# Patient Record
Sex: Male | Born: 1970 | Race: White | Hispanic: No | Marital: Married | Smoking: Current every day smoker
Health system: Southern US, Community
[De-identification: ages and names within clinical notes are randomized; demographics above are authoritative.]

## PROBLEM LIST (undated history)

## (undated) DIAGNOSIS — I10 Essential (primary) hypertension: Secondary | ICD-10-CM

## (undated) DIAGNOSIS — M6740 Ganglion, unspecified site: Secondary | ICD-10-CM

## (undated) HISTORY — PX: WISDOM TOOTH EXTRACTION: SHX21

---

## 2011-08-03 DIAGNOSIS — M6740 Ganglion, unspecified site: Secondary | ICD-10-CM

## 2011-08-03 HISTORY — DX: Ganglion, unspecified site: M67.40

## 2011-08-10 ENCOUNTER — Encounter (HOSPITAL_BASED_OUTPATIENT_CLINIC_OR_DEPARTMENT_OTHER): Payer: Self-pay | Admitting: *Deleted

## 2011-08-13 ENCOUNTER — Other Ambulatory Visit: Payer: Self-pay | Admitting: Orthopedic Surgery

## 2011-08-17 ENCOUNTER — Encounter (HOSPITAL_BASED_OUTPATIENT_CLINIC_OR_DEPARTMENT_OTHER): Payer: Self-pay | Admitting: Certified Registered Nurse Anesthetist

## 2011-08-17 ENCOUNTER — Ambulatory Visit (HOSPITAL_BASED_OUTPATIENT_CLINIC_OR_DEPARTMENT_OTHER): Payer: 59 | Admitting: Certified Registered Nurse Anesthetist

## 2011-08-17 ENCOUNTER — Ambulatory Visit (HOSPITAL_BASED_OUTPATIENT_CLINIC_OR_DEPARTMENT_OTHER)
Admission: RE | Admit: 2011-08-17 | Discharge: 2011-08-17 | Disposition: A | Discharge status: Home / Self Care | Payer: 59 | Source: Ambulatory Visit | Attending: Orthopedic Surgery | Admitting: Orthopedic Surgery

## 2011-08-17 ENCOUNTER — Encounter (HOSPITAL_BASED_OUTPATIENT_CLINIC_OR_DEPARTMENT_OTHER): Payer: Self-pay | Admitting: Anesthesiology

## 2011-08-17 ENCOUNTER — Encounter (HOSPITAL_BASED_OUTPATIENT_CLINIC_OR_DEPARTMENT_OTHER): Payer: Self-pay | Admitting: Orthopedic Surgery

## 2011-08-17 ENCOUNTER — Other Ambulatory Visit: Payer: Self-pay | Admitting: Orthopedic Surgery

## 2011-08-17 ENCOUNTER — Encounter (HOSPITAL_BASED_OUTPATIENT_CLINIC_OR_DEPARTMENT_OTHER)
Admission: RE | Disposition: A | Discharge status: Home / Self Care | Payer: Self-pay | Source: Ambulatory Visit | Attending: Orthopedic Surgery

## 2011-08-17 ENCOUNTER — Encounter (HOSPITAL_BASED_OUTPATIENT_CLINIC_OR_DEPARTMENT_OTHER): Payer: Self-pay | Admitting: *Deleted

## 2011-08-17 DIAGNOSIS — D1610 Benign neoplasm of short bones of unspecified upper limb: Secondary | ICD-10-CM | POA: Insufficient documentation

## 2011-08-17 HISTORY — PX: EAR CYST EXCISION: SHX22

## 2011-08-17 HISTORY — DX: Ganglion, unspecified site: M67.40

## 2011-08-17 LAB — POCT HEMOGLOBIN-HEMACUE: Hemoglobin: 14.4 g/dL (ref 13.0–17.0)

## 2011-08-17 SURGERY — CYST REMOVAL
Anesthesia: Regional | Site: Hand | Laterality: Right | Wound class: Clean

## 2011-08-17 MED ORDER — MIDAZOLAM HCL 2 MG/2ML IJ SOLN: 0.5000 mg | INTRAMUSCULAR | Status: DC | PRN

## 2011-08-17 MED ORDER — HYDROCODONE-ACETAMINOPHEN 5-500 MG PO TABS: 1.0000 | ORAL_TABLET | ORAL | Status: AC | PRN

## 2011-08-17 MED ORDER — FENTANYL CITRATE 0.05 MG/ML IJ SOLN: 25.0000 ug | INTRAMUSCULAR | Status: DC | PRN

## 2011-08-17 MED ORDER — MORPHINE SULFATE 4 MG/ML IJ SOLN: 0.0500 mg/kg | INTRAMUSCULAR | Status: DC | PRN

## 2011-08-17 MED ORDER — PROPOFOL 10 MG/ML IV EMUL
INTRAVENOUS | Status: DC | PRN
  Administered 2011-08-17: 75 ug/kg/min via INTRAVENOUS

## 2011-08-17 MED ORDER — CHLORHEXIDINE GLUCONATE 4 % EX LIQD: 60.0000 mL | Freq: Once | CUTANEOUS | Status: DC

## 2011-08-17 MED ORDER — CEFAZOLIN SODIUM 1-5 GM-% IV SOLN: 1.0000 g | INTRAVENOUS | Status: DC

## 2011-08-17 MED ORDER — BUPIVACAINE HCL (PF) 0.25 % IJ SOLN
INTRAMUSCULAR | Status: DC | PRN
  Administered 2011-08-17: 10 mL

## 2011-08-17 MED ORDER — LIDOCAINE HCL (PF) 0.5 % IJ SOLN
INTRAMUSCULAR | Status: DC | PRN
  Administered 2011-08-17: 30 mL via INTRATHECAL

## 2011-08-17 MED ORDER — METOCLOPRAMIDE HCL 5 MG/ML IJ SOLN: 10.0000 mg | Freq: Once | INTRAMUSCULAR | Status: DC | PRN

## 2011-08-17 MED ORDER — FENTANYL CITRATE 0.05 MG/ML IJ SOLN: 50.0000 ug | INTRAMUSCULAR | Status: DC | PRN

## 2011-08-17 MED ORDER — MIDAZOLAM HCL 5 MG/5ML IJ SOLN
INTRAMUSCULAR | Status: DC | PRN
  Administered 2011-08-17: 2 mg via INTRAVENOUS

## 2011-08-17 MED ORDER — LACTATED RINGERS IV SOLN
INTRAVENOUS | Status: DC
  Administered 2011-08-17: 11:00:00 via INTRAVENOUS

## 2011-08-17 MED ORDER — ONDANSETRON HCL 4 MG/2ML IJ SOLN
INTRAMUSCULAR | Status: DC | PRN
  Administered 2011-08-17: 4 mg via INTRAVENOUS

## 2011-08-17 MED ORDER — CEFAZOLIN SODIUM 1-5 GM-% IV SOLN
1.0000 g | INTRAVENOUS | Status: AC
  Administered 2011-08-17: 2 g via INTRAVENOUS

## 2011-08-17 MED ORDER — FENTANYL CITRATE 0.05 MG/ML IJ SOLN
INTRAMUSCULAR | Status: DC | PRN
  Administered 2011-08-17: 100 ug via INTRAVENOUS

## 2011-08-17 SURGICAL SUPPLY — 49 items
BANDAGE COBAN STERILE 2 (GAUZE/BANDAGES/DRESSINGS) IMPLANT
BANDAGE GAUZE ELAST BULKY 4 IN (GAUZE/BANDAGES/DRESSINGS) IMPLANT
BLADE MINI RND TIP GREEN BEAV (BLADE) IMPLANT
BLADE SURG 15 STRL LF DISP TIS (BLADE) ×1 IMPLANT
BLADE SURG 15 STRL SS (BLADE) ×1
BNDG COHESIVE 1X5 TAN STRL LF (GAUZE/BANDAGES/DRESSINGS) ×2 IMPLANT
BNDG COHESIVE 3X5 TAN STRL LF (GAUZE/BANDAGES/DRESSINGS) IMPLANT
BNDG ESMARK 4X9 LF (GAUZE/BANDAGES/DRESSINGS) IMPLANT
CHLORAPREP W/TINT 26ML (MISCELLANEOUS) ×2 IMPLANT
CLOTH BEACON ORANGE TIMEOUT ST (SAFETY) ×2 IMPLANT
CORDS BIPOLAR (ELECTRODE) ×2 IMPLANT
COVER MAYO STAND STRL (DRAPES) ×2 IMPLANT
COVER TABLE BACK 60X90 (DRAPES) ×2 IMPLANT
CUFF TOURNIQUET SINGLE 18IN (TOURNIQUET CUFF) ×2 IMPLANT
DECANTER SPIKE VIAL GLASS SM (MISCELLANEOUS) IMPLANT
DRAIN PENROSE 1/2X12 LTX STRL (WOUND CARE) IMPLANT
DRAPE EXTREMITY T 121X128X90 (DRAPE) ×2 IMPLANT
DRAPE SURG 17X23 STRL (DRAPES) ×2 IMPLANT
GAUZE SPONGE 4X4 12PLY STRL LF (GAUZE/BANDAGES/DRESSINGS) ×2 IMPLANT
GAUZE XEROFORM 1X8 LF (GAUZE/BANDAGES/DRESSINGS) ×2 IMPLANT
GLOVE BIO SURGEON STRL SZ 6.5 (GLOVE) IMPLANT
GLOVE SKINSENSE NS SZ6.5 (GLOVE) ×1
GLOVE SKINSENSE STRL SZ6.5 (GLOVE) ×1 IMPLANT
GLOVE SURG ORTHO 8.0 STRL STRW (GLOVE) ×2 IMPLANT
GOWN BRE IMP PREV XXLGXLNG (GOWN DISPOSABLE) ×2 IMPLANT
GOWN PREVENTION PLUS XLARGE (GOWN DISPOSABLE) ×2 IMPLANT
NEEDLE 27GAX1X1/2 (NEEDLE) ×2 IMPLANT
NS IRRIG 1000ML POUR BTL (IV SOLUTION) ×2 IMPLANT
PACK BASIN DAY SURGERY FS (CUSTOM PROCEDURE TRAY) ×2 IMPLANT
PAD CAST 3X4 CTTN HI CHSV (CAST SUPPLIES) IMPLANT
PADDING CAST ABS 3INX4YD NS (CAST SUPPLIES)
PADDING CAST ABS 4INX4YD NS (CAST SUPPLIES)
PADDING CAST ABS COTTON 3X4 (CAST SUPPLIES) IMPLANT
PADDING CAST ABS COTTON 4X4 ST (CAST SUPPLIES) IMPLANT
PADDING CAST COTTON 3X4 STRL (CAST SUPPLIES)
SPLINT FINGER 5/8X3.25 (SOFTGOODS) ×1 IMPLANT
SPLINT FINGER FOAM 3 9119 05 (SOFTGOODS) ×2
SPLINT PLASTER CAST XFAST 3X15 (CAST SUPPLIES) IMPLANT
SPLINT PLASTER XTRA FASTSET 3X (CAST SUPPLIES)
SPONGE GAUZE 4X4 12PLY (GAUZE/BANDAGES/DRESSINGS) IMPLANT
STOCKINETTE 4X48 STRL (DRAPES) ×2 IMPLANT
SUT VIC AB 4-0 P2 18 (SUTURE) IMPLANT
SUT VICRYL RAPID 5 0 P 3 (SUTURE) IMPLANT
SUT VICRYL RAPIDE 4/0 PS 2 (SUTURE) ×2 IMPLANT
SYR BULB 3OZ (MISCELLANEOUS) ×2 IMPLANT
SYR CONTROL 10ML LL (SYRINGE) ×2 IMPLANT
TOWEL OR 17X24 6PK STRL BLUE (TOWEL DISPOSABLE) ×2 IMPLANT
UNDERPAD 30X30 INCONTINENT (UNDERPADS AND DIAPERS) ×2 IMPLANT
WATER STERILE IRR 1000ML POUR (IV SOLUTION) IMPLANT

## 2011-08-17 NOTE — H&P (Signed)
  Billy Mccarty is a 41 year old right hand dominant male who comes in complaining of a mass over the DIP joint of his right middle finger. This has been present since November. He states that frequently it will pop. He has drained clear fluid out of it. He has a history of gout however. He has no history of diabetes, thyroid problems or arthritis. He complains of intermittent, moderate, stabbing and throbbing pain with a feeling of swelling. It does occasionally awaken him at night. He has not had any treatment. He states that it was open this past weekend.   Past Medical History: He has no allergies. He takes no medicines. He has had no surgery.   Family Medical History: Negative.  Social History: He smokes 2-3 cigarettes a night and is advised to quit. He drinks socially. 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  Review of Systems: Negative for 14 points. Billy Mccarty is an 41 y.o. male.   Chief Complaint: mass rmf HPI: see above  Past Medical History  Diagnosis Date  . 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 Wisdom tooth extraction     History reviewed. No pertinent family history. Social History:  reports that he has never smoked. His smokeless tobacco use includes Snuff. He reports that he drinks alcohol. He reports that he does not use illicit drugs.  Allergies: No Known Allergies  Medications Prior to Admission  Medication Dose Route Frequency Provider Last Rate Last Dose  . ceFAZolin (ANCEF) IVPB 1 g/50 mL premix  1 g Intravenous 60 min Pre-Op       . chlorhexidine (HIBICLENS) 4 % liquid 4 application  60 mL Topical Once       . fentaNYL (SUBLIMAZE) injection 50-100 mcg  50-100 mcg Intravenous PRN Constance Goltz, MD      . lactated ringers infusion   Intravenous Continuous Constance Goltz, MD 20 mL/hr at 08/17/11 1121    . midazolam (VERSED) injection 0.5-2 mg  0.5-2 mg Intravenous PRN Constance Goltz, MD       Medications Prior to Admission  Medication Sig Dispense Refill  . cephALEXin (KEFLEX) 250 MG capsule Take 250 mg by mouth 4 (four) times daily.        No results found for this or any previous visit (from the past 48 hour(s)).  No results found.   Pertinent items are noted in HPI.  Blood pressure 155/101, pulse 95, temperature 98 F (36.7 C), temperature source Oral, resp. rate 18, height 5\' 10"  (1.778 m), weight 115.667 kg (255 lb), SpO2 100.00%.  General appearance: alert, cooperative and appears stated age Head: Normocephalic, without obvious abnormality Neck: no adenopathy Resp: clear to auscultation bilaterally Cardio: regular rate and rhythm, S1, S2 normal, no murmur, click, rub or gallop GI: soft, non-tender; bowel sounds normal; no masses,  no organomegaly Extremities: extremities normal, atraumatic, no cyanosis or edema Pulses: 2+ and symmetric Skin: Skin color, texture, turgor normal. No rashes or lesions Neurologic: Grossly normal Incision/Wound: na  Assessment/Plan Diagnosis: (1) Gout. (2) Mucoid tumor.   The pre, peri and post op course are discussed along with risks and complications. He is aware there is no guarantee with surgery, possibility of infection, recurrence, injury to arteries, nerves and tendons, incomplete relief of symptoms and dystrophy.    Billy Mccarty R 08/17/2011, 11:32 AM

## 2011-08-17 NOTE — Anesthesia Preprocedure Evaluation (Signed)
Anesthesia Evaluation  Patient identified by MRN, date of birth, ID band Patient awake    Reviewed: Allergy & Precautions, H&P , NPO status , Patient's Chart, lab work & pertinent test results, reviewed documented beta blocker date and time   Airway Mallampati: II TM Distance: >3 FB Neck ROM: full    Dental   Pulmonary neg pulmonary ROS,          Cardiovascular neg cardio ROS     Neuro/Psych Negative Neurological ROS  Negative Psych ROS   GI/Hepatic negative GI ROS, Neg liver ROS,   Endo/Other  Morbid obesity  Renal/GU negative Renal ROS  Genitourinary negative   Musculoskeletal   Abdominal   Peds  Hematology negative hematology ROS (+)   Anesthesia Other Findings See surgeon's H&P   Reproductive/Obstetrics negative OB ROS                           Anesthesia Physical Anesthesia Plan  ASA: III  Anesthesia Plan: MAC and Bier Block   Post-op Pain Management:    Induction: Intravenous  Airway Management Planned: Simple Face Mask  Additional Equipment:   Intra-op Plan:   Post-operative Plan:   Informed Consent: I have reviewed the patients History and Physical, chart, labs and discussed the procedure including the risks, benefits and alternatives for the proposed anesthesia with the patient or authorized representative who has indicated his/her understanding and acceptance.     Plan Discussed with: CRNA and Surgeon  Anesthesia Plan Comments:         Anesthesia Quick Evaluation

## 2011-08-17 NOTE — Anesthesia Postprocedure Evaluation (Signed)
Anesthesia Post Note  Patient: Billy Mccarty  Procedure(s) Performed: Procedure(s) (LRB): CYST REMOVAL (Right)  Anesthesia type: MAC  Patient location: PACU  Post pain: Pain level controlled  Post assessment: Patient's Cardiovascular Status Stable  Last Vitals:  Filed Vitals:   08/17/11 1317  BP:   Pulse: 84  Temp:   Resp: 25    Post vital signs: Reviewed and stable  Level of consciousness: alert  Complications: No apparent anesthesia complications

## 2011-08-17 NOTE — Discharge Instructions (Addendum)
[facility] Instructions °Hand Surgery ° °Wound Care: °Keep your hand elevated above the level of your heart.  Do not allow it to dangle  by your side.  Keep the dressing dry and do not remove it unless your doctor advises you to do so.  He will usually change it at the time of your post-op visit.  Moving your fingers is advised to stimulate circulation but will depend on the site of your surgery.  If you have a splint applied, your doctor will advise you regarding movement. ° °Activity: °Do not drive or operate machinery today.  Rest today and then you may return to your normal activity and work as indicated by your physician. ° °Diet:  °Drink liquids today or eat a light diet.  You may resume a regular diet tomorrow.   ° °General expectations: °Pain for two to three days. °Fingers may become slightly swollen. ° °Call your doctor if any of the following occur: °Severe pain not relieved by pain medication. °Elevated temperature. °Dressing soaked with blood. °Inability to move fingers. °White or bluish color to fingers.[facility]  °[address] °[place], [place] [address] °[phone] ° ° ° ° ° ° °Post Anesthesia Home Care Instructions ° °Activity: °Get plenty of rest for the remainder of the day. A responsible adult should stay with you for 24 hours following the procedure.  °For the next 24 hours, DO NOT: °-Drive a car °-Operate machinery °-Drink alcoholic beverages °-Take any medication unless instructed by your physician °-Make any legal decisions or sign important papers. ° °Meals: °Start with liquid foods such as gelatin or soup. Progress to regular foods as tolerated. Avoid greasy, spicy, heavy foods. If nausea and/or vomiting occur, drink only clear liquids until the nausea and/or vomiting subsides. Call your physician if vomiting continues. ° °Special Instructions/Symptoms: °Your throat may feel dry or sore from the anesthesia or the breathing tube placed in your throat during  surgery. If this causes discomfort, gargle with warm salt water. The discomfort should disappear within 24 hours. ° ° ° ° ° ° Call your surgeon if you experience:  ° °1.  Fever over 101.0. °2.  Inability to urinate. °3.  Nausea and/or vomiting. °4.  Extreme swelling or bruising at the surgical site. °5.  Continued bleeding from the incision. °6.  Increased pain, redness or drainage from the incision. °7.  Problems related to your pain medication.  °

## 2011-08-17 NOTE — Brief Op Note (Signed)
08/17/2011  1:11 PM  PATIENT:  Billy Mccarty  41 y.o. male  PRE-OPERATIVE DIAGNOSIS:  Mucoid cyst distal interphangeal joint of right middle finger  POST-OPERATIVE DIAGNOSIS:  Mucoid cyst distal interphangeal joint of right middle finger  PROCEDURE:  Procedure(s) (LRB): CYST REMOVAL (Right)  SURGEON:  Surgeon(s) and Role:    * Nicki Reaper, MD - Primary  PHYSICIAN ASSISTANT:   ASSISTANTS: none   ANESTHESIA:   local and regional  EBL:  Total I/O In: 800 [I.V.:800] Out: -   BLOOD ADMINISTERED:none  DRAINS: none   LOCAL MEDICATIONS USED:  MARCAINE     SPECIMEN:  Excision  DISPOSITION OF SPECIMEN:  PATHOLOGY  COUNTS:  YES  TOURNIQUET:  * Missing tourniquet times found for documented tourniquets in log:  23015 *  DICTATION: .Other Dictation: Dictation Number [id number]  PLAN OF CARE: Discharge to home after PACU  PATIENT DISPOSITION:  PACU - hemodynamically stable.

## 2011-08-17 NOTE — Op Note (Signed)
Dictated [id number]

## 2011-08-17 NOTE — Op Note (Signed)
NAME:  Billy Mccarty, Billy Mccarty               ACCOUNT NO.:  [id number]  MEDICAL RECORD NO.:  [id number]  LOCATION:                                 FACILITY:  PHYSICIAN:  Cindee Salt, M.D.            DATE OF BIRTH:  DATE OF PROCEDURE:  08/17/2011 DATE OF DISCHARGE:                              OPERATIVE REPORT   PREOPERATIVE DIAGNOSIS:  Mucoid cyst, distal interphalangeal joint, right middle finger.  POSTOPERATIVE DIAGNOSIS:  Mucoid cyst, distal interphalangeal joint, right middle finger.  OPERATION:  Excision of cyst with debridement, distal interphalangeal joint, right middle finger.  SURGEON:  Cindee Salt, M.D.  ANESTHESIA:  Forearm-based IV regional with metacarpal block.  ANESTHESIOLOGIST:  Janetta Hora. Gelene Mink, M.D.  HISTORY:  The patient is a 41 year old male with a history of a mucoid cyst of his right middle finger.  This has opened up.  He has recently been treated with antibiotics until closure.  Certainty, no infectious process has remained.  He is admitted now for excision of the cyst and debridement, distal interphalangeal joint.  Pre, peri, postoperative course have been discussed along with the risks and complications.  He is aware that there is no guarantee with the surgery, possibility of infection; recurrence; injury to arteries; nerves; tendons; incomplete relief of symptoms; and dystrophy.  In the preoperative area, the patient is seen.  The extremity marked by both the patient and surgeon. Antibiotic given.  PROCEDURE:  The patient was brought to the operating room where a forearm-based IV regional anesthetic was carried out without difficulty, was prepped using ChloraPrep, supine position, right arm free.  A 3- minute dry time was allowed.  Time-out taken, confirming the patient and procedure.  A curvilinear incision was made.  He had some feeling, a metacarpal block was given with 0.25% Marcaine without epinephrine, 7 mL was used.  After adequate anesthesia was  afforded, the wound was deepened.  The cyst was immediately encountered with the skin.  With a sharp and blunt dissection, this was dissected free, followed down into the distal phalangeal joint.  The joint was opened on its radial aspect. A synovectomy was performed.  Osteophytes removed with a small rongeur. No further lesions were identified.  The wound was irrigated and the skin closed with interrupted 4-0 Vicryl Rapide sutures.  A sterile compressive dressing and splint to the finger was applied.  On deflation of the tourniquet, remaining fingers pinked.  He was taken to the recovery room for observation in satisfactory condition.          ______________________________ Cindee Salt, M.D.     GK/MEDQ  D:  08/17/2011  T:  08/17/2011  Job:  [id number]

## 2011-08-17 NOTE — Transfer of Care (Signed)
Immediate Anesthesia Transfer of Care Note  Patient: Billy Mccarty  Procedure(s) Performed: Procedure(s) (LRB): CYST REMOVAL (Right)  Patient Location: PACU  Anesthesia Type: MAC and Regional  Level of Consciousness: awake, alert  and oriented  Airway & Oxygen Therapy: Patient Spontanous Breathing and Patient connected to face mask oxygen  Post-op Assessment: Report given to PACU RN and Post -op Vital signs reviewed and stable  Post vital signs: Reviewed and stable  Complications: No apparent anesthesia complications

## 2011-08-17 NOTE — Anesthesia Procedure Notes (Addendum)
Performed by: Zenia Resides D   Procedure Name: MAC Date/Time: 08/17/2011 12:40 PM Performed by: Zenia Resides D Pre-anesthesia Checklist: Patient identified, Emergency Drugs available, Suction available, Patient being monitored and Timeout performed Oxygen Delivery Method: Simple face mask

## 2011-08-20 ENCOUNTER — Encounter (HOSPITAL_BASED_OUTPATIENT_CLINIC_OR_DEPARTMENT_OTHER): Payer: Self-pay | Admitting: Orthopedic Surgery

## 2016-04-17 ENCOUNTER — Ambulatory Visit (INDEPENDENT_AMBULATORY_CARE_PROVIDER_SITE_OTHER): Payer: Self-pay | Admitting: Sports Medicine

## 2016-04-24 ENCOUNTER — Encounter (INDEPENDENT_AMBULATORY_CARE_PROVIDER_SITE_OTHER): Payer: Self-pay | Admitting: Sports Medicine

## 2016-04-24 ENCOUNTER — Ambulatory Visit (INDEPENDENT_AMBULATORY_CARE_PROVIDER_SITE_OTHER): Payer: 59 | Admitting: Sports Medicine

## 2016-04-24 VITALS — BP 174/111 | HR 93 | Ht 70.0 in

## 2016-04-24 DIAGNOSIS — M25461 Effusion, right knee: Secondary | ICD-10-CM

## 2016-04-24 DIAGNOSIS — M25561 Pain in right knee: Secondary | ICD-10-CM

## 2016-04-24 MED ORDER — BUPIVACAINE HCL 0.5 % IJ SOLN
2.0000 mL | INTRAMUSCULAR | Status: AC | PRN
  Administered 2016-04-24: 2 mL via INTRA_ARTICULAR

## 2016-04-24 MED ORDER — METHYLPREDNISOLONE ACETATE 40 MG/ML IJ SUSP
80.0000 mg | INTRAMUSCULAR | Status: AC | PRN
  Administered 2016-04-24: 80 mg

## 2016-04-24 NOTE — Progress Notes (Signed)
Billy Mccarty - 45 y.o. male MRN [id number]  Date of birth: 03/21/1971  Office Visit Note: Visit Date: 04/24/2016 PCP: Billy Maid, MD Referred by: Billy Maid, MD  Assessment & Plan: Visit Diagnoses:  1. Acute pain of right knee   2. Effusion, right knee     Plan:   chronic gout issues versus pseudogout likely reflective of underlying inflammatory changes of the joint capsule. No true mechanical symptoms at this time. We'll go ahead & repeat injection today however will need to wait at minimum 3 months before any further injections. I would like to recheck lab work in 4 weeks & have him continue with the allopurinol. Okay to wean off colchicine as tolerated. Should wear compression on a regular basis. Consider repeat imaging if persistent symptoms   There are no Patient Instructions on file for this visit.  Meds & Orders: No orders of the defined types were placed in this encounter.   Orders Placed This Encounter  Procedures  . Large Joint Injection/Arthrocentesis  . US Guided Needle Placement    Follow-up: Return in about 4 weeks (around 05/22/2016) for uric acid recheck, repeat clinical exam.   Procedures: US Guided Left Knee Injection Date/Time: 04/24/2016 4:54 PM Performed by: Billy Mccarty Authorized by: Billy Mccarty   Indications:  Pain and joint swelling Location:  Knee Site:  R knee Approach:  Superolateral Ultrasound Guidance: Yes (Images stored to PACS)   Fluoroscopic Guidance: No  no Medications:  2 mL bupivacaine 0.5 %; 80 mg methylPREDNISolone acetate 40 MG/ML Aspirate amount (mL):  35 Aspirate:  Yellow and clear Comments: After informed consent was obtained & all questions were answered the target sight was prepped in sterile fashion using alcohol, ethel chloride and sterile ultrasound technique. Subsequently using a 25g needle, 26mL 1% lidocaine was used for local anesthetic via the above approach. Under real-time ultrasound guidance,  using  an 18g needle, aspirate was obtained as above. Subsequently, using sterile stopcock technique, injection of the above medications was performed without difficulty. Band-Aid and 6 inch Ace wrap was applied. Patient tolerated this procedure well with no immediate complications.     Clinical History: No additional findings.  He reports that he has never smoked. His smokeless tobacco use includes Snuff.  Subjective: Chief Complaint  Patient presents with  . Right Knee - Gout   Pt. States that taking medication for gout in Right knee. On Sunday RT knee felt bruised, Monday RT knee felt like kneewas locking up. This morning had pain in RT knee, tender on top of RT knee.  Some swelling. Had been feeling better.  Has been taking allopurinol & colchicine for the past 2 weeks with overall good improvement until he went out late on Saturday with his son. Sunday he was slightly sore. Discussing different than his typical gout flare. No fevers chills recent weight gain or weight loss.   Review of Systems  Constitutional: Negative.        Otherwise per HPI    Objective:  VS:  HT:5\' 10"  (177.8 cm)   WT:   BMI:     BP:(!) 174/111  HR:93bpm  TEMP: ( )  RESP:  Physical Exam  Constitutional: He appears well-developed and well-nourished. No distress.  Pulmonary/Chest: Effort normal. No respiratory distress.  Skin: He is not diaphoretic.  Psychiatric: He has a normal mood and affect. Judgment normal.    Right Knee Exam   Comments:  Well aligned knee. He has a large effusion on  the right with a small effusion on the left. He has no focal joint line tenderness. Extensor mechanism is intact. Slight loss of extension by 5 due to the effusion. Ligamentously stable. Sensation is intact to light touch.     Imaging: No results found.  Past Medical/Family/Surgical/Social History: There are no active problems to display for this patient.  Past Medical History:  Diagnosis Date  . Mucoid cyst of  joint 08/2011   right middle finger   No family history on file. Past Surgical History:  Procedure Laterality Date  . EAR CYST EXCISION  08/17/2011   Procedure: CYST REMOVAL;  Surgeon: Billy Sours, MD;  Location: [facility];  Service: Orthopedics;  Laterality: Right;  Excision cyst, debridement distal interphalangeal right middle finger  . WISDOM TOOTH EXTRACTION     Social History   Occupational History  . Not on file.   Social History Main Topics  . Smoking status: Never Smoker  . Smokeless tobacco: Current User    Types: Snuff  . Alcohol use Yes     Comment: few times/week  . Drug use: No  . Sexual activity: Not on file

## 2016-05-22 ENCOUNTER — Ambulatory Visit (INDEPENDENT_AMBULATORY_CARE_PROVIDER_SITE_OTHER): Payer: 59 | Admitting: Sports Medicine

## 2016-06-02 ENCOUNTER — Other Ambulatory Visit (INDEPENDENT_AMBULATORY_CARE_PROVIDER_SITE_OTHER): Payer: Self-pay | Admitting: Sports Medicine

## 2016-06-04 NOTE — Telephone Encounter (Signed)
Rx refill request

## 2016-11-30 ENCOUNTER — Other Ambulatory Visit (INDEPENDENT_AMBULATORY_CARE_PROVIDER_SITE_OTHER): Payer: Self-pay | Admitting: Sports Medicine

## 2016-12-27 ENCOUNTER — Other Ambulatory Visit (INDEPENDENT_AMBULATORY_CARE_PROVIDER_SITE_OTHER): Payer: Self-pay | Admitting: Sports Medicine

## 2017-01-08 ENCOUNTER — Telehealth: Payer: Self-pay | Admitting: Sports Medicine

## 2017-01-08 ENCOUNTER — Other Ambulatory Visit: Payer: Self-pay

## 2017-01-08 MED ORDER — ALLOPURINOL 300 MG PO TABS: ORAL_TABLET | ORAL | 0 refills | Status: DC

## 2017-01-08 NOTE — Telephone Encounter (Signed)
Please call in 2 week supply of zyloprim to [facility] on file in [place]

## 2017-01-08 NOTE — Telephone Encounter (Signed)
2 week supply sent to [facility], pt aware.

## 2017-01-11 ENCOUNTER — Encounter: Payer: Self-pay | Admitting: Sports Medicine

## 2017-01-11 ENCOUNTER — Ambulatory Visit (INDEPENDENT_AMBULATORY_CARE_PROVIDER_SITE_OTHER): Payer: 59 | Admitting: Sports Medicine

## 2017-01-11 VITALS — BP 140/100 | HR 87 | Ht 72.0 in | Wt 290.8 lb

## 2017-01-11 DIAGNOSIS — I10 Essential (primary) hypertension: Secondary | ICD-10-CM | POA: Diagnosis not present

## 2017-01-11 DIAGNOSIS — M25561 Pain in right knee: Secondary | ICD-10-CM | POA: Diagnosis not present

## 2017-01-11 DIAGNOSIS — M1A09X0 Idiopathic chronic gout, multiple sites, without tophus (tophi): Secondary | ICD-10-CM | POA: Diagnosis not present

## 2017-01-11 LAB — CBC
HCT: 44.1 % (ref 38.5–50.0)
Hemoglobin: 14.9 g/dL (ref 13.2–17.1)
MCH: 30.9 pg (ref 27.0–33.0)
MCHC: 33.8 g/dL (ref 32.0–36.0)
MCV: 91.5 fL (ref 80.0–100.0)
MPV: 9.5 fL (ref 7.5–12.5)
Platelets: 202 10*3/uL (ref 140–400)
RBC: 4.82 MIL/uL (ref 4.20–5.80)
RDW: 13.7 % (ref 11.0–15.0)
WBC: 9.8 10*3/uL (ref 3.8–10.8)

## 2017-01-11 NOTE — Progress Notes (Signed)
OFFICE VISIT NOTE Juanda Bond. [place], [place] at [place] [phone]  Billy Mccarty - 46 y.o. male MRN [id number]  Date of birth: 06/17/1971  Visit Date: 01/11/2017  PCP: Rubie Maid, MD   Referred by: Rubie Maid, MD   Jari Sportsman, cma acting as scribe for Dr. Paulla Fore.  SUBJECTIVE:   Chief Complaint  Patient presents with  . Follow-up  . Rt Knee Pain   HPI: As below and per problem based documentation when appropriate.  Billy received a steroid injection with aspiration on 04-24-2016. He is taking Allopurinol daily for sx. He reports very minimal pain when he does have an attack. Overall doing well. He is requesting a refill on medciaton today.    Review of Systems  Constitutional: Negative for chills, diaphoresis, fever, malaise/fatigue and weight loss.  HENT: Negative.   Eyes: Negative.   Respiratory: Negative.   Cardiovascular: Negative for chest pain and palpitations.  Gastrointestinal: Negative.   Genitourinary: Negative.   Musculoskeletal: Negative for back pain, falls, joint pain, myalgias and neck pain.  Skin: Negative for itching and rash.  Neurological: Negative.  Negative for weakness.  Endo/Heme/Allergies: Negative for environmental allergies and polydipsia. Does not bruise/bleed easily.  Psychiatric/Behavioral: Negative.     Otherwise per HPI.  HISTORY & PERTINENT PRIOR DATA:  No specialty comments available. He reports that he has never smoked. His smokeless tobacco use includes Snuff.   Medications & Allergies reviewed per EMR Patient Active Problem List   Diagnosis Date Noted  . Gout 02/19/2017  . Essential hypertension 02/19/2017  . Right knee pain 02/19/2017   Past Medical History:  Diagnosis Date  . 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 No pertinent family history. Past Surgical History:  Procedure Laterality Date  . EAR CYST EXCISION  08/17/2011   Procedure: CYST REMOVAL;  Surgeon: Wynonia Sours, MD;  Location: [facility];  Service: Orthopedics;  Laterality: Right;  Excision cyst, debridement distal interphalangeal right middle finger  . WISDOM TOOTH EXTRACTION     Social History   Occupational History  . Not on file.   Social History Main Topics  . Smoking status: Never Smoker  . Smokeless tobacco: Current User    Types: Snuff  . Alcohol use Yes     Comment: few times/week  . Drug use: No  . Sexual activity: Not on file    OBJECTIVE:  VS:  HT:6' (182.9 cm)   WT:290 lb 12.8 oz (131.9 kg)  BMI:39.5    BP:(!) 140/100  HR:87bpm  TEMP: ( )  RESP:98 % EXAM: Findings:  Adult male.  No acute distress.  Alert and appropriate. Bilateral lower extremities overall well aligned.  No significant deformity. His bilateral knees do have small amount of swelling with trace effusions.  Lower extremity has 1+ pitting edema bilaterally.  No pain with calf squeeze test.  He has small picking lesions on his legs but these are minimal and no evidence of infection.     No results found. ASSESSMENT & PLAN:     ICD-10-CM   1. Right knee pain, unspecified chronicity M25.561 Comprehensive metabolic panel    Uric acid    CBC  2. Idiopathic chronic gout of multiple sites without tophus M1A.09X0   3. Essential hypertension I10   ================================================================= Gout Overall he seems to be doing quite well.  He is occasionally having minimal flareups I would like to ensure that  his levels are good.  We can titrate his medications if indicated we will go ahead and recheck labs today.  Refills on his allopurinol will be provided once labs return.     We will need to six-month monitoring of lab work and I am happy to see him back at any time for acute flareups.  Essential hypertension Persistently elevated today.  Recommend close monitoring and follow-up with his PCP for further titration of his  medications.  Right knee pain Prior acute flareups with large effusion secondary to gout.  Overall seems to be doing quite well but has occasional discomfort.  Continue with compression as needed =================================================================  Follow-up: No Follow-up on file.   CMA/ATC served as Education administrator during this visit. History, Physical, and Plan performed by medical provider. Documentation and orders reviewed and attested to.      Teresa Coombs, [facility] Physician

## 2017-01-12 LAB — COMPREHENSIVE METABOLIC PANEL
ALBUMIN: 4.7 g/dL (ref 3.6–5.1)
ALT: 59 U/L — ABNORMAL HIGH (ref 9–46)
AST: 44 U/L — ABNORMAL HIGH (ref 10–40)
Alkaline Phosphatase: 77 U/L (ref 40–115)
BUN: 16 mg/dL (ref 7–25)
CALCIUM: 9.5 mg/dL (ref 8.6–10.3)
CHLORIDE: 104 mmol/L (ref 98–110)
CO2: 21 mmol/L (ref 20–31)
Creat: 1.1 mg/dL (ref 0.60–1.35)
Glucose, Bld: 95 mg/dL (ref 65–99)
POTASSIUM: 4.4 mmol/L (ref 3.5–5.3)
Sodium: 140 mmol/L (ref 135–146)
TOTAL PROTEIN: 7.2 g/dL (ref 6.1–8.1)
Total Bilirubin: 0.4 mg/dL (ref 0.2–1.2)

## 2017-01-12 LAB — URIC ACID: URIC ACID, SERUM: 5.8 mg/dL (ref 4.0–8.0)

## 2017-01-16 ENCOUNTER — Other Ambulatory Visit: Payer: Self-pay

## 2017-01-16 MED ORDER — ALLOPURINOL 300 MG PO TABS: ORAL_TABLET | ORAL | 1 refills | Status: DC

## 2017-01-17 ENCOUNTER — Ambulatory Visit: Payer: 59 | Admitting: Sports Medicine

## 2017-01-23 ENCOUNTER — Other Ambulatory Visit: Payer: Self-pay

## 2017-01-23 MED ORDER — ALLOPURINOL 300 MG PO TABS: ORAL_TABLET | ORAL | 1 refills | Status: AC

## 2017-02-19 DIAGNOSIS — M109 Gout, unspecified: Secondary | ICD-10-CM | POA: Insufficient documentation

## 2017-02-19 DIAGNOSIS — I10 Essential (primary) hypertension: Secondary | ICD-10-CM | POA: Insufficient documentation

## 2017-02-19 DIAGNOSIS — M25561 Pain in right knee: Secondary | ICD-10-CM | POA: Insufficient documentation

## 2017-02-19 NOTE — Assessment & Plan Note (Signed)
Prior acute flareups with large effusion secondary to gout.  Overall seems to be doing quite well but has occasional discomfort.  Continue with compression as needed

## 2017-02-19 NOTE — Assessment & Plan Note (Addendum)
Overall he seems to be doing quite well.  He is occasionally having minimal flareups I would like to ensure that his levels are good.  We can titrate his medications if indicated we will go ahead and recheck labs today.  Refills on his allopurinol will be provided once labs return.     We will need to six-month monitoring of lab work and I am happy to see him back at any time for acute flareups.

## 2017-02-19 NOTE — Assessment & Plan Note (Signed)
Persistently elevated today.  Recommend close monitoring and follow-up with his PCP for further titration of his medications.

## 2017-02-26 DIAGNOSIS — L638 Other alopecia areata: Secondary | ICD-10-CM | POA: Diagnosis not present

## 2017-02-27 ENCOUNTER — Other Ambulatory Visit: Payer: Self-pay | Admitting: Sports Medicine

## 2017-02-28 ENCOUNTER — Telehealth: Payer: Self-pay | Admitting: Sports Medicine

## 2017-02-28 NOTE — Telephone Encounter (Signed)
MEDICATION:   losartan (COZAAR) 25 MG tablet     PHARMACY:   [facility], [place] [phone] (Phone) [phone] (Fax)     IS THIS A 90 DAY SUPPLY : Y   IS PATIENT OUT OF MEDICATION: N  IF NOT; HOW MUCH IS LEFT: 1  LAST APPOINTMENT DATE: 01/11/17  NEXT APPOINTMENT DATE: N/A  OTHER COMMENTS:    **Let patient know to contact pharmacy at the end of the day to make sure medication is ready. **  ** Please notify patient to allow 48-72 hours to process**  **Encourage patient to contact the pharmacy for refills or they can request refills through [facility]**

## 2017-02-28 NOTE — Telephone Encounter (Signed)
Patient's wife called to check the status of the rx refill. I advised her that all sports medicine staff is gone for the day and that Dr. Paulla Fore will fill tomorrow morning, if there are any problems the nurse will call him.

## 2017-03-01 NOTE — Telephone Encounter (Signed)
I haven't been prescribing this medication.  This is a blood pressure med, not gout medication.  Needs to come from his PCP.

## 2017-03-01 NOTE — Telephone Encounter (Signed)
Per last OV note:  Recommend close monitoring and follow-up with his PCP for further titration of his medications.  Dr. Paulla Fore, Diaz to refill x 30 days and have pt f/u with PCP?

## 2017-03-01 NOTE — Telephone Encounter (Signed)
Called pt and left VM to contact the office. He needs to contact his PCP for refills of BP medication.

## 2017-03-03 ENCOUNTER — Other Ambulatory Visit: Payer: Self-pay | Admitting: Sports Medicine

## 2017-03-05 NOTE — Telephone Encounter (Signed)
Called and left VM for pt to contact his PCP for refill.

## 2017-03-28 DIAGNOSIS — L638 Other alopecia areata: Secondary | ICD-10-CM | POA: Diagnosis not present

## 2017-04-22 DIAGNOSIS — H1045 Other chronic allergic conjunctivitis: Secondary | ICD-10-CM | POA: Diagnosis not present

## 2017-06-30 ENCOUNTER — Other Ambulatory Visit: Payer: Self-pay

## 2017-06-30 ENCOUNTER — Emergency Department (HOSPITAL_BASED_OUTPATIENT_CLINIC_OR_DEPARTMENT_OTHER)
Admission: EM | Admit: 2017-06-30 | Discharge: 2017-06-30 | Disposition: A | Discharge status: Home / Self Care | Payer: 59 | Attending: Emergency Medicine | Admitting: Emergency Medicine

## 2017-06-30 ENCOUNTER — Emergency Department (HOSPITAL_BASED_OUTPATIENT_CLINIC_OR_DEPARTMENT_OTHER): Payer: 59

## 2017-06-30 ENCOUNTER — Encounter (HOSPITAL_BASED_OUTPATIENT_CLINIC_OR_DEPARTMENT_OTHER): Payer: Self-pay | Admitting: Emergency Medicine

## 2017-06-30 DIAGNOSIS — F17210 Nicotine dependence, cigarettes, uncomplicated: Secondary | ICD-10-CM | POA: Insufficient documentation

## 2017-06-30 DIAGNOSIS — Z79899 Other long term (current) drug therapy: Secondary | ICD-10-CM | POA: Insufficient documentation

## 2017-06-30 DIAGNOSIS — R079 Chest pain, unspecified: Secondary | ICD-10-CM | POA: Insufficient documentation

## 2017-06-30 DIAGNOSIS — R0789 Other chest pain: Secondary | ICD-10-CM | POA: Diagnosis not present

## 2017-06-30 DIAGNOSIS — I10 Essential (primary) hypertension: Secondary | ICD-10-CM | POA: Insufficient documentation

## 2017-06-30 HISTORY — DX: Essential (primary) hypertension: I10

## 2017-06-30 LAB — CBC
HCT: 43.1 % (ref 39.0–52.0)
Hemoglobin: 14.5 g/dL (ref 13.0–17.0)
MCH: 30.7 pg (ref 26.0–34.0)
MCHC: 33.6 g/dL (ref 30.0–36.0)
MCV: 91.3 fL (ref 78.0–100.0)
PLATELETS: 251 10*3/uL (ref 150–400)
RBC: 4.72 MIL/uL (ref 4.22–5.81)
RDW: 12.5 % (ref 11.5–15.5)
WBC: 10.5 10*3/uL (ref 4.0–10.5)

## 2017-06-30 LAB — BASIC METABOLIC PANEL
Anion gap: 9 (ref 5–15)
BUN: 19 mg/dL (ref 6–20)
CO2: 25 mmol/L (ref 22–32)
CREATININE: 0.94 mg/dL (ref 0.61–1.24)
Calcium: 9.3 mg/dL (ref 8.9–10.3)
Chloride: 101 mmol/L (ref 101–111)
GFR calc Af Amer: >60 mL/min (ref >60)
Glucose, Bld: 163 mg/dL — ABNORMAL HIGH (ref 65–99)
Potassium: 3.9 mmol/L (ref 3.5–5.1)
SODIUM: 135 mmol/L (ref 135–145)

## 2017-06-30 LAB — TROPONIN I: Troponin I: <0.03 ng/mL (ref <0.03)

## 2017-06-30 MED ORDER — ASPIRIN 81 MG PO CHEW
324.0000 mg | CHEWABLE_TABLET | Freq: Once | ORAL | Status: AC
Start: 2017-06-30 — End: 2017-06-30
  Administered 2017-06-30: 324 mg via ORAL
  Filled 2017-06-30: qty 4

## 2017-06-30 MED ORDER — LOSARTAN POTASSIUM 25 MG PO TABS: 25.0000 mg | ORAL_TABLET | Freq: Every day | ORAL | 0 refills | Status: AC

## 2017-06-30 NOTE — Discharge Instructions (Signed)
Schedule an appointment with a primary care doctor to follow-up on your blood pressure and the episode of chest pain.  Return to the emergency room for worsening or recurrent symptoms

## 2017-06-30 NOTE — ED Triage Notes (Addendum)
Patient reports chest pain that began while sitting.  States that he began feeling like he was going to pass out.  States he began feeling tight in his neck and states "I feel like my head was about to pop".  Reports hx of HTN but states he has not taken his medication due to ortho doctor prescribing medication.  Ortho doc stated he needed to follow with primary but patient has not done this yet.  States he called EMS-pt with BP 270/130 today, refused EMS transport.

## 2017-06-30 NOTE — ED Provider Notes (Signed)
[facility] EMERGENCY DEPARTMENT Provider Note   CSN: [id number] Arrival date & time: 06/30/17  1418     History   Chief Complaint Chief Complaint  Patient presents with  . Chest Pain    HPI Billy Mccarty is a 46 y.o. male.  HPI Pt started having pain in his chest today around 1:30 while sitting.  It was in the center of his chest.   It lasted about a minute in a half.  He also felt lightheaded and had tightness in his neck and head.   No shortness of breath or nausea.   No fever or vomiting.  No cough.  He had a headache earlier that he describes as his head feeling like it was going to pop but it resolved.  Pt has been told that he is hypertensive   he was given a prescription prior doctor that was treating him for gout.  He took that medication for a few months and he was told to follow-up with a primary care doctor to continue treatment of his hypertension.  Patient has not followed up.  When EMS arrived they noted that his blood pressure was very elevated over 200.  Patient did not take any medications from EMS.  He refused transport and came to the emergency room.  He is actually feeling better now than he did earlier.  No history of heart disease.  Patient does smoke.  He does have hypertension.  No mediate family history of early heart disease Past Medical History:  Diagnosis Date  . Hypertension   . Mucoid cyst of joint 08/2011   right middle finger    Patient Active Problem List   Diagnosis Date Noted  . Gout 02/19/2017  . Essential hypertension 02/19/2017  . Right knee pain 02/19/2017    Past Surgical History:  Procedure Laterality Date  . EAR CYST EXCISION  08/17/2011   Procedure: CYST REMOVAL;  Surgeon: Wynonia Sours, MD;  Location: [facility];  Service: Orthopedics;  Laterality: Right;  Excision cyst, debridement distal interphalangeal right middle finger  . WISDOM TOOTH EXTRACTION         Home Medications    Prior to Admission  medications   Medication Sig Start Date End Date Taking? Authorizing Provider  allopurinol (ZYLOPRIM) 300 MG tablet TAKE ONE TABLET BY MOUTH ONCE DAILY. 01/23/17   Gerda Diss, DO  losartan (COZAAR) 25 MG tablet Take 1 tablet (25 mg total) by mouth daily. 06/30/17   Dorie Rank, MD    Family History History reviewed. No pertinent family history.  Social History Social History   Tobacco Use  . Smoking status: Current Every Day Smoker    Packs/day: 1.00    Types: Cigarettes  . Smokeless tobacco: Current User    Types: Snuff  Substance Use Topics  . Alcohol use: Yes    Comment: few times/week  . Drug use: No     Allergies   Patient has no known allergies.   Review of Systems Review of Systems  All other systems reviewed and are negative.    Physical Exam Updated Vital Signs BP (!) 145/93   Pulse 88   Temp 98.2 F (36.8 C) (Oral)   Resp 15   Ht 1.778 m (5\' 10" )   Wt 124.7 kg (275 lb)   SpO2 98%   BMI 39.46 kg/m   Physical Exam  Constitutional: He appears well-developed and well-nourished. No distress.  HENT:  Head: Normocephalic and atraumatic.  Right  Ear: External ear normal.  Left Ear: External ear normal.  Eyes: Conjunctivae are normal. Right eye exhibits no discharge. Left eye exhibits no discharge. No scleral icterus.  Neck: Neck supple. No tracheal deviation present.  Cardiovascular: Normal rate, regular rhythm and intact distal pulses.  Pulmonary/Chest: Effort normal and breath sounds normal. No stridor. No respiratory distress. He has no wheezes. He has no rales.  Abdominal: Soft. Bowel sounds are normal. He exhibits no distension. There is no tenderness. There is no rebound and no guarding.  Musculoskeletal: He exhibits no edema or tenderness.  Neurological: He is alert. He has normal strength. No cranial nerve deficit (no facial droop, extraocular movements intact, no slurred speech) or sensory deficit. He exhibits normal muscle tone. He displays  no seizure activity. Coordination normal.  Skin: Skin is warm and dry. No rash noted.  Psychiatric: He has a normal mood and affect.  Nursing note and vitals reviewed.    ED Treatments / Results  Labs (all labs ordered are listed, but only abnormal results are displayed) Labs Reviewed  BASIC METABOLIC PANEL - Abnormal; Notable for the following components:      Result Value   Glucose, Bld 163 (*)    All other components within normal limits  TROPONIN I  TROPONIN I  CBC    EKG  EKG Interpretation  Date/Time:  "Sunday June 30 2017 14:21:52 EST Ventricular Rate:  86 PR Interval:  164 QRS Duration: 92 QT Interval:  354 QTC Calculation: 423 R Axis:   -27 Text Interpretation:  Normal sinus rhythm Normal ECG No old tracing to compare Confirmed by Dyneshia Baccam ([id number]) on 06/30/2017 3:04:47 PM       Radiology Dg Chest 2 View  Result Date: 06/30/2017 CLINICAL DATA:  Chest pain today EXAM: CHEST  2 VIEW COMPARISON:  None. FINDINGS: The heart size and mediastinal contours are within normal limits. There is no focal infiltrate, pulmonary edema, or pleural effusion. Osteophytosis of bilateral first rib manubrial joints are noted. IMPRESSION: No active cardiopulmonary disease. Electronically Signed   By: Wei-Chen  Lin M.D.   On: 06/30/2017 15:47    Procedures Procedures (including critical care time)  Medications Ordered in ED Medications  aspirin chewable tablet 324 mg (324 mg Oral Given 06/30/17 1539)     Initial Impression / Assessment and Plan / ED Course  I have reviewed the triage vital signs and the nursing notes.  Pertinent labs & imaging results that were available during my care of the patient were reviewed by me and considered in my medical decision making (see chart for details).   Patient presented to the emergency room for evaluation of chest pain.  Patient is overall low risk for cardiac disease.  He does however have a history of hypertension that he has not  been treating.  Patient's EKG and serial cardiac enzymes are reassuring.  His blood pressure improved with treatment.  I think he stable for discharge and outpatient follow-up.  I do think is reasonable to have a stress test as an outpatient.  We discussed the importance of close follow-up.  he is comfortable with this plan understands return if he has any recurrent episodes.  Final Clinical Impressions(s) / ED Diagnoses   Final diagnoses:  Chest pain, unspecified type    ED Discharge Orders        Ordered    losartan (COZAAR) 25 MG tablet  Daily     12" /30/18 2001       Dorie Rank, MD  06/30/17 2004  

## 2017-07-18 DIAGNOSIS — I10 Essential (primary) hypertension: Secondary | ICD-10-CM | POA: Diagnosis not present

## 2017-07-18 DIAGNOSIS — Z13220 Encounter for screening for lipoid disorders: Secondary | ICD-10-CM | POA: Diagnosis not present

## 2017-07-18 DIAGNOSIS — M1A09X0 Idiopathic chronic gout, multiple sites, without tophus (tophi): Secondary | ICD-10-CM | POA: Diagnosis not present

## 2017-08-01 DIAGNOSIS — I10 Essential (primary) hypertension: Secondary | ICD-10-CM | POA: Diagnosis not present

## 2017-10-21 DIAGNOSIS — T148XXA Other injury of unspecified body region, initial encounter: Secondary | ICD-10-CM | POA: Diagnosis not present

## 2017-11-11 DIAGNOSIS — T148XXA Other injury of unspecified body region, initial encounter: Secondary | ICD-10-CM | POA: Diagnosis not present

## 2018-01-22 DIAGNOSIS — M1A09X0 Idiopathic chronic gout, multiple sites, without tophus (tophi): Secondary | ICD-10-CM | POA: Diagnosis not present

## 2018-01-22 DIAGNOSIS — R748 Abnormal levels of other serum enzymes: Secondary | ICD-10-CM | POA: Diagnosis not present

## 2018-01-22 DIAGNOSIS — Z0000 Encounter for general adult medical examination without abnormal findings: Secondary | ICD-10-CM | POA: Diagnosis not present

## 2018-05-16 DIAGNOSIS — F17210 Nicotine dependence, cigarettes, uncomplicated: Secondary | ICD-10-CM | POA: Diagnosis not present

## 2018-05-16 DIAGNOSIS — J0100 Acute maxillary sinusitis, unspecified: Secondary | ICD-10-CM | POA: Diagnosis not present

## 2018-06-29 DIAGNOSIS — R05 Cough: Secondary | ICD-10-CM | POA: Diagnosis not present

## 2018-06-29 DIAGNOSIS — R062 Wheezing: Secondary | ICD-10-CM | POA: Diagnosis not present

## 2018-06-29 DIAGNOSIS — J209 Acute bronchitis, unspecified: Secondary | ICD-10-CM | POA: Diagnosis not present

## 2019-03-28 IMAGING — DX DG CHEST 2V
2 series · 2 of 2 positions shown · non-contrast
Comparison: None.

CLINICAL DATA: Chest pain today

EXAM:
CHEST  2 VIEW

[chest pa]
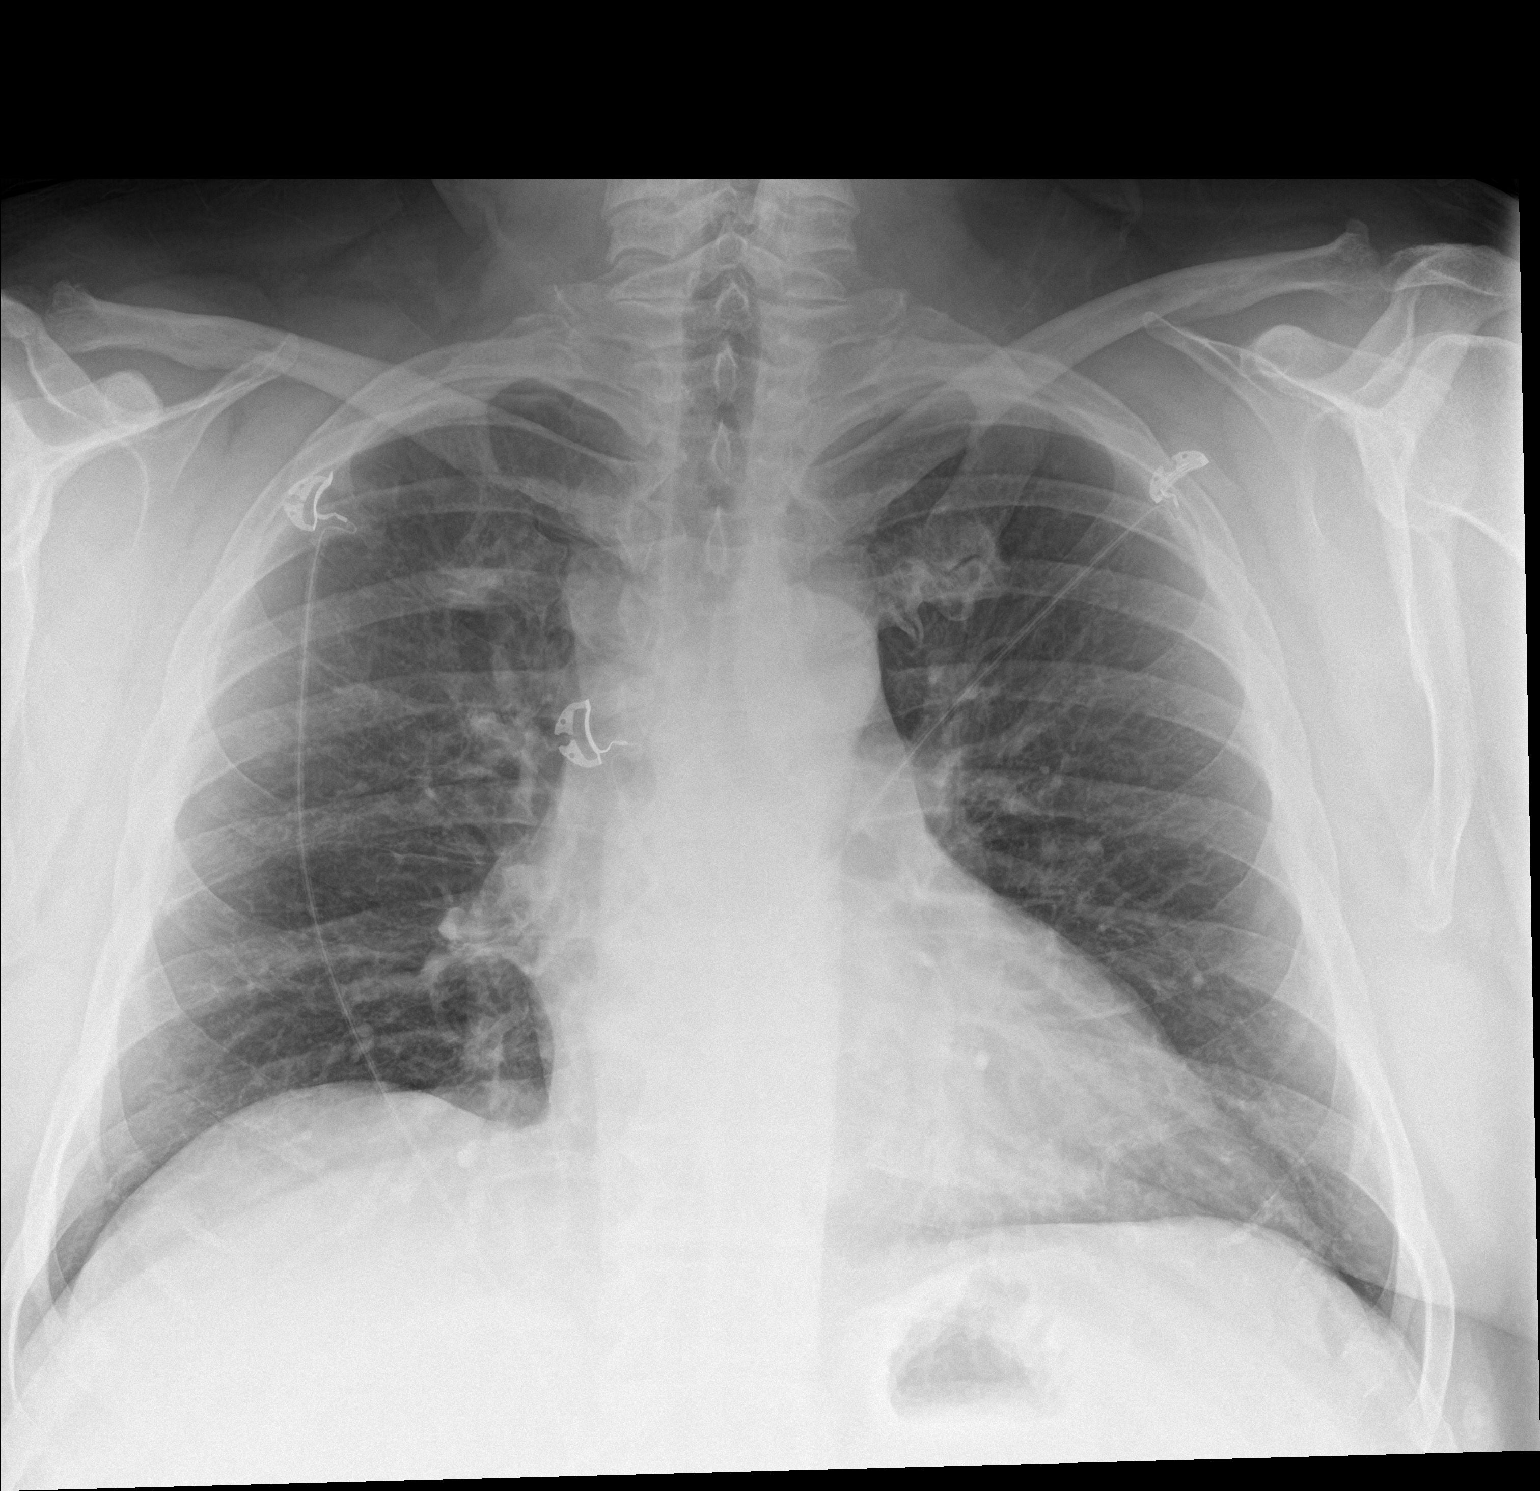

[chest lat]
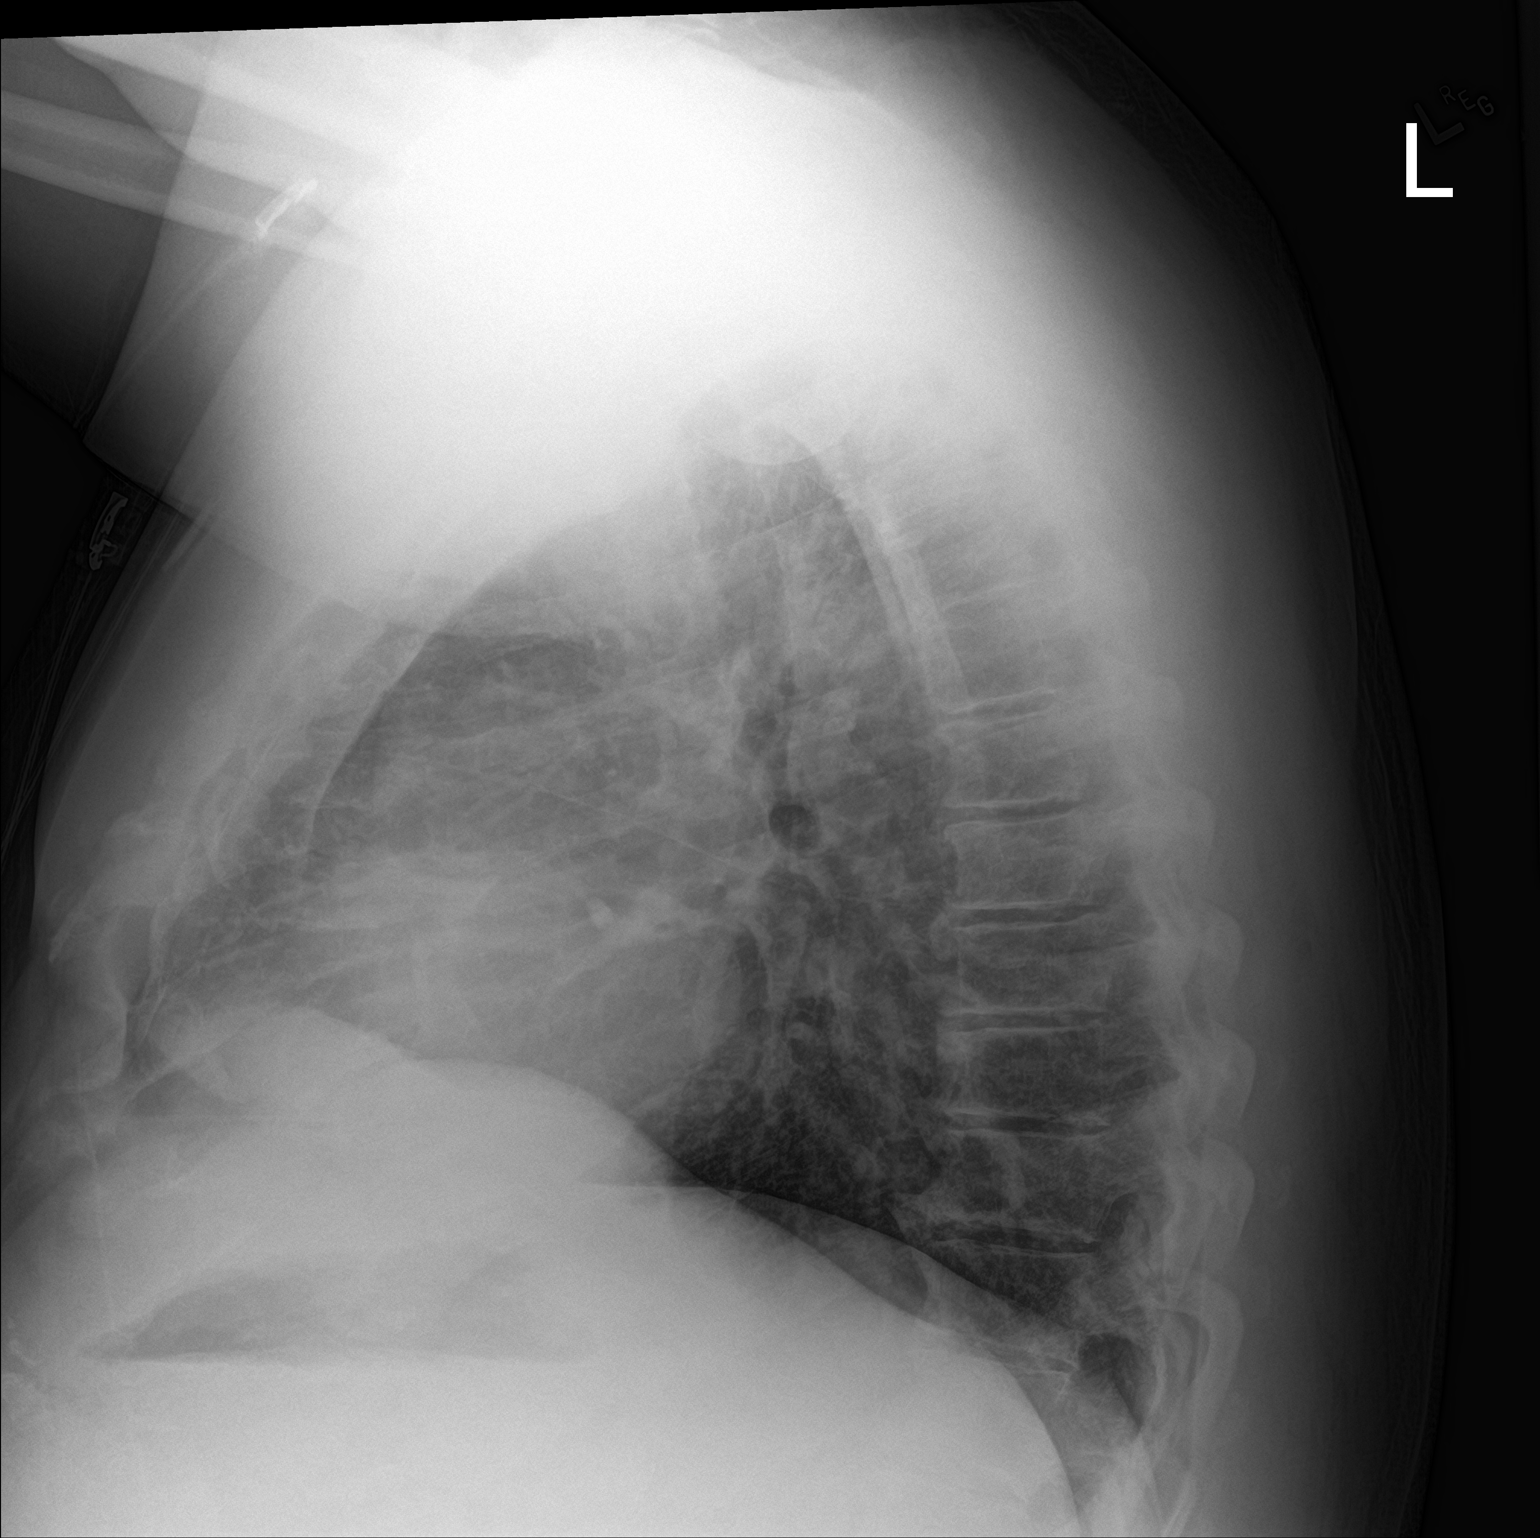

[2 of 2 positions shown; findings below may reference images not displayed]

FINDINGS: The heart size and mediastinal contours are within normal limits.
There is no focal infiltrate, pulmonary edema, or pleural effusion.
Osteophytosis of bilateral first rib manubrial joints are noted.
IMPRESSION: No active cardiopulmonary disease.

## 2022-11-28 ENCOUNTER — Other Ambulatory Visit (HOSPITAL_COMMUNITY): Payer: Self-pay

## 2022-11-30 ENCOUNTER — Other Ambulatory Visit (HOSPITAL_BASED_OUTPATIENT_CLINIC_OR_DEPARTMENT_OTHER): Payer: Self-pay

## 2022-11-30 ENCOUNTER — Other Ambulatory Visit (HOSPITAL_COMMUNITY): Payer: Self-pay

## 2022-11-30 MED ORDER — MOUNJARO 5 MG/0.5ML ~~LOC~~ SOAJ
5.0000 mg | SUBCUTANEOUS | 3 refills | Status: AC
  Filled 2022-11-30: qty 2, 28d supply, fill #0
  Filled 2022-12-11: qty 2, 28d supply, fill #1
  Filled 2023-01-21 – 2023-02-27 (×3): qty 2, 28d supply, fill #2

## 2022-12-11 ENCOUNTER — Other Ambulatory Visit (HOSPITAL_COMMUNITY): Payer: Self-pay

## 2022-12-21 ENCOUNTER — Other Ambulatory Visit (HOSPITAL_COMMUNITY): Payer: Self-pay

## 2022-12-26 ENCOUNTER — Other Ambulatory Visit (HOSPITAL_COMMUNITY): Payer: Self-pay

## 2023-01-21 ENCOUNTER — Other Ambulatory Visit (HOSPITAL_COMMUNITY): Payer: Self-pay

## 2023-01-23 ENCOUNTER — Other Ambulatory Visit (HOSPITAL_COMMUNITY): Payer: Self-pay

## 2023-01-28 ENCOUNTER — Other Ambulatory Visit (HOSPITAL_COMMUNITY): Payer: Self-pay

## 2023-01-28 MED ORDER — MOUNJARO 5 MG/0.5ML ~~LOC~~ SOAJ
SUBCUTANEOUS | 3 refills | Status: DC
  Filled 2023-01-28: qty 2, 28d supply, fill #0
  Filled 2023-02-11: qty 2, 28d supply, fill #1
  Filled 2023-02-21: qty 2, 84d supply, fill #1

## 2023-01-31 ENCOUNTER — Other Ambulatory Visit (HOSPITAL_COMMUNITY): Payer: Self-pay

## 2023-02-11 ENCOUNTER — Other Ambulatory Visit (HOSPITAL_COMMUNITY): Payer: Self-pay

## 2023-02-21 ENCOUNTER — Other Ambulatory Visit: Payer: Self-pay

## 2023-02-27 ENCOUNTER — Other Ambulatory Visit (HOSPITAL_COMMUNITY): Payer: Self-pay

## 2023-03-08 ENCOUNTER — Other Ambulatory Visit (HOSPITAL_COMMUNITY): Payer: Self-pay
